# Patient Record
Sex: Female | Born: 1965 | Race: White | Hispanic: No | Marital: Married | Smoking: Never smoker
Health system: Southern US, Community
[De-identification: ages and names within clinical notes are randomized; demographics above are authoritative.]

## PROBLEM LIST (undated history)

## (undated) DIAGNOSIS — M791 Myalgia, unspecified site: Secondary | ICD-10-CM

## (undated) DIAGNOSIS — E785 Hyperlipidemia, unspecified: Secondary | ICD-10-CM

## (undated) DIAGNOSIS — D219 Benign neoplasm of connective and other soft tissue, unspecified: Secondary | ICD-10-CM

## (undated) DIAGNOSIS — E7800 Pure hypercholesterolemia, unspecified: Secondary | ICD-10-CM

## (undated) DIAGNOSIS — I2510 Atherosclerotic heart disease of native coronary artery without angina pectoris: Secondary | ICD-10-CM

## (undated) HISTORY — DX: Pure hypercholesterolemia, unspecified: E78.00

## (undated) HISTORY — DX: Hyperlipidemia, unspecified: E78.5

## (undated) HISTORY — DX: Benign neoplasm of connective and other soft tissue, unspecified: D21.9

## (undated) HISTORY — DX: Myalgia, unspecified site: M79.10

## (undated) HISTORY — DX: Atherosclerotic heart disease of native coronary artery without angina pectoris: I25.10

---

## 2006-07-19 ENCOUNTER — Inpatient Hospital Stay (HOSPITAL_COMMUNITY): Admission: EM | Admit: 2006-07-19 | Discharge: 2006-07-24 | Payer: Self-pay | Admitting: Interventional Cardiology

## 2006-08-08 ENCOUNTER — Encounter (HOSPITAL_COMMUNITY): Admission: RE | Admit: 2006-08-08 | Discharge: 2006-11-06 | Payer: Self-pay | Admitting: Interventional Cardiology

## 2010-06-11 HISTORY — PX: ABDOMINAL HYSTERECTOMY: SHX81

## 2013-03-07 ENCOUNTER — Other Ambulatory Visit: Payer: Self-pay | Admitting: Interventional Cardiology

## 2013-03-07 DIAGNOSIS — E785 Hyperlipidemia, unspecified: Secondary | ICD-10-CM

## 2013-03-28 ENCOUNTER — Encounter: Payer: Self-pay | Admitting: *Deleted

## 2013-03-28 ENCOUNTER — Encounter: Payer: Self-pay | Admitting: Interventional Cardiology

## 2013-03-30 ENCOUNTER — Other Ambulatory Visit: Payer: Self-pay

## 2013-03-31 ENCOUNTER — Ambulatory Visit: Payer: Self-pay | Admitting: Interventional Cardiology

## 2013-03-31 ENCOUNTER — Other Ambulatory Visit: Payer: Self-pay

## 2013-03-31 DIAGNOSIS — I2510 Atherosclerotic heart disease of native coronary artery without angina pectoris: Secondary | ICD-10-CM | POA: Insufficient documentation

## 2013-03-31 DIAGNOSIS — E785 Hyperlipidemia, unspecified: Secondary | ICD-10-CM | POA: Insufficient documentation

## 2013-03-31 DIAGNOSIS — I252 Old myocardial infarction: Secondary | ICD-10-CM | POA: Insufficient documentation

## 2013-10-01 ENCOUNTER — Telehealth: Payer: Self-pay | Admitting: Interventional Cardiology

## 2013-10-01 NOTE — Telephone Encounter (Signed)
Disregard prior documentation. ERROR

## 2013-10-01 NOTE — Telephone Encounter (Signed)
New message          Pt would like to have blood work done first before yearly visit.

## 2013-10-01 NOTE — Telephone Encounter (Signed)
pt was original given cardiac clearance in Jan 2015 by Dr.Smith. pt sts that surgery with Dr.byerly was pushed back to June 11 @ [facility].pt wanted Dr.Smith to know and wanted to know if she was still cleared .routed to [facility] for adv.

## 2013-10-05 NOTE — Telephone Encounter (Signed)
Follow up     Can pt have blood work done before her yearly appt?  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

## 2013-10-05 NOTE — Telephone Encounter (Signed)
pt aware fasting lab appt made for 11/05/13.past due annual with Dr.Smith 11/12/13 @10am  pt aware and verbalized understanding.

## 2013-11-05 ENCOUNTER — Other Ambulatory Visit (INDEPENDENT_AMBULATORY_CARE_PROVIDER_SITE_OTHER): Payer: 59

## 2013-11-05 DIAGNOSIS — E785 Hyperlipidemia, unspecified: Secondary | ICD-10-CM

## 2013-11-05 LAB — ALT: ALT: 11 U/L (ref 0–35)

## 2013-11-06 LAB — NMR LIPOPROFILE WITH LIPIDS
Cholesterol, Total: 188 mg/dL (ref <200)
HDL Particle Number: 42.8 umol/L (ref >=30.5)
HDL SIZE: 9.5 nm (ref >=9.2)
HDL-C: 77 mg/dL (ref >=40)
LARGE VLDL-P: 0.8 nmol/L (ref <=2.7)
LDL (calc): 96 mg/dL (ref <100)
LDL PARTICLE NUMBER: 1320 nmol/L — AB (ref <1000)
LDL Size: 20.7 nm (ref >20.5)
LP-IR Score: <25 (ref <=45)
Large HDL-P: 14.5 umol/L (ref >=4.8)
Small LDL Particle Number: 436 nmol/L (ref <=527)
TRIGLYCERIDES: 76 mg/dL (ref <150)
VLDL Size: 39.3 nm (ref <=46.6)

## 2013-11-10 ENCOUNTER — Encounter: Payer: Self-pay | Admitting: *Deleted

## 2013-11-12 ENCOUNTER — Encounter: Payer: Self-pay | Admitting: Interventional Cardiology

## 2013-11-12 ENCOUNTER — Ambulatory Visit (INDEPENDENT_AMBULATORY_CARE_PROVIDER_SITE_OTHER): Payer: 59 | Admitting: Interventional Cardiology

## 2013-11-12 VITALS — BP 130/82 | HR 50 | Ht 59.5 in | Wt 153.0 lb

## 2013-11-12 DIAGNOSIS — I2510 Atherosclerotic heart disease of native coronary artery without angina pectoris: Secondary | ICD-10-CM

## 2013-11-12 DIAGNOSIS — E785 Hyperlipidemia, unspecified: Secondary | ICD-10-CM

## 2013-11-12 DIAGNOSIS — I252 Old myocardial infarction: Secondary | ICD-10-CM

## 2013-11-12 MED ORDER — NITROGLYCERIN 0.4 MG SL SUBL: 0.4000 mg | SUBLINGUAL_TABLET | SUBLINGUAL | Status: DC | PRN

## 2013-11-12 MED ORDER — ATORVASTATIN CALCIUM 40 MG PO TABS: 40.0000 mg | ORAL_TABLET | Freq: Every day | ORAL | Status: AC

## 2013-11-12 NOTE — Patient Instructions (Signed)
Your physician recommends that you continue on your current medications as directed. Please refer to the Current Medication list given to you today.  Your physician has requested that you have an exercise tolerance test. For further information please visit [email or web address]. Please also follow instruction sheet, as given.   Your physician wants you to follow-up in: 1 year You will receive a reminder letter in the mail two months in advance. If you don't receive a letter, please call our office to schedule the follow-up appointment.  

## 2013-11-12 NOTE — Progress Notes (Signed)
Patient ID: Nicole Grimes, female   DOB: Dec 15, 1965, 48 y.o.   MRN: [id number]    [address] [place], [place]  [address] Phone: [phone] Fax:  [phone]  Date:  11/12/2013   ID:  Nicole Grimes, DOB May 02, 1966, MRN [id number]  PCP:  No primary provider on file.   ASSESSMENT:  1. Coronary atherosclerosis with prior right coronary stent implantation, 2007 2. Recent atypical chest pain 3. Hyperlipidemia with most recent cholesterol concentration of 96 but with a particle #[id number]   PLAN:  1. exercise treadmill test 2. Clinical followup in one year 3. Weight loss and active lifestyle   SUBJECTIVE: Nicole Grimes is a 48 y.o. female who is doing relatively well. All the past 2 weeks she has had atypical discomfort in the chest. Episodes of occurred at random. She is nitroglycerin on one occasion while under a lot of stress at work. The work environment has been stressful because people being laid off. She feels fatigued and has not been able sleep as well. She denies orthopnea, PND, and exertional discomfort in the chest. There is no peripheral edema. No medication side effects appear   Wt Readings from Last 3 Encounters:  11/12/13 153 lb (69.4 kg)     Past Medical History  Diagnosis Date  . Coronary atherosclerosis of native coronary artery     with acute inferior infarction, complicated by ventricular fibrillation x7. Bare metal stent in the proximal right coronary, 2008.  Marland Kitchen Hyperlipidemia   . Myalgia   . Hypercholesteremia   . Fibroid     Current Outpatient Prescriptions  Medication Sig Dispense Refill  . aspirin 81 MG tablet Take 81 mg by mouth daily.      Marland Kitchen atorvastatin (LIPITOR) 40 MG tablet Take 40 mg by mouth daily.      . Multiple Vitamin (MULTIVITAMIN) capsule Take 1 capsule by mouth daily.      . nitroGLYCERIN (NITROSTAT) 0.4 MG SL tablet Place 0.4 mg under the tongue every 5 (five) minutes as needed for chest pain.       No current  facility-administered medications for this visit.    Allergies:    Allergies  Allergen Reactions  . Morphine Sulfate     Social History:  The patient  reports that she has never smoked. She does not have any smokeless tobacco history on file. She reports that she does not drink alcohol.   ROS:  Please see the history of present illness.   No episodes of syncope or palpitation   All other systems reviewed and negative.   OBJECTIVE: VS:  BP 130/82  Pulse 50  Ht 4' 11.5" (1.511 m)  Wt 153 lb (69.4 kg)  BMI 30.40 kg/m2 Well nourished, well developed, in no acute distress, weight loss is apparent HEENT: normal Neck: JVD flat. Carotid bruit absent  Cardiac:  normal S1, S2; RRR; no murmur Lungs:  clear to auscultation bilaterally, no wheezing, rhonchi or rales Abd: soft, nontender, no hepatomegaly Ext: Edema absent. Pulses 2+ Skin: warm and dry Neuro:  CNs 2-12 intact, no focal abnormalities noted  EKG:  Normal sinus rhythm with small inferior Q waves. No change from prior       Signed, Illene Labrador III, MD 11/12/2013 10:50 AM

## 2013-12-31 ENCOUNTER — Encounter: Payer: 59 | Admitting: Physician Assistant

## 2015-07-15 ENCOUNTER — Emergency Department (HOSPITAL_COMMUNITY)
Admission: EM | Admit: 2015-07-15 | Discharge: 2015-07-15 | Disposition: A | Discharge status: Home / Self Care | Payer: 59 | Attending: Emergency Medicine | Admitting: Emergency Medicine

## 2015-07-15 ENCOUNTER — Emergency Department (HOSPITAL_COMMUNITY): Payer: 59

## 2015-07-15 ENCOUNTER — Encounter (HOSPITAL_COMMUNITY): Payer: Self-pay

## 2015-07-15 DIAGNOSIS — Z7982 Long term (current) use of aspirin: Secondary | ICD-10-CM | POA: Insufficient documentation

## 2015-07-15 DIAGNOSIS — R000 Tachycardia, unspecified: Secondary | ICD-10-CM | POA: Diagnosis present

## 2015-07-15 DIAGNOSIS — Z79899 Other long term (current) drug therapy: Secondary | ICD-10-CM | POA: Diagnosis not present

## 2015-07-15 DIAGNOSIS — Z86018 Personal history of other benign neoplasm: Secondary | ICD-10-CM | POA: Insufficient documentation

## 2015-07-15 DIAGNOSIS — E782 Mixed hyperlipidemia: Secondary | ICD-10-CM | POA: Diagnosis not present

## 2015-07-15 DIAGNOSIS — I471 Supraventricular tachycardia: Secondary | ICD-10-CM | POA: Insufficient documentation

## 2015-07-15 DIAGNOSIS — E785 Hyperlipidemia, unspecified: Secondary | ICD-10-CM | POA: Insufficient documentation

## 2015-07-15 LAB — I-STAT TROPONIN, ED: TROPONIN I, POC: 0.02 ng/mL (ref 0.00–0.08)

## 2015-07-15 LAB — COMPREHENSIVE METABOLIC PANEL
ALT: 12 U/L — ABNORMAL LOW (ref 14–54)
AST: 15 U/L (ref 15–41)
Albumin: 3.7 g/dL (ref 3.5–5.0)
Alkaline Phosphatase: 35 U/L — ABNORMAL LOW (ref 38–126)
Anion gap: 10 (ref 5–15)
BUN: 8 mg/dL (ref 6–20)
CO2: 22 mmol/L (ref 22–32)
Calcium: 9 mg/dL (ref 8.9–10.3)
Chloride: 108 mmol/L (ref 101–111)
Creatinine, Ser: 0.8 mg/dL (ref 0.44–1.00)
GFR calc Af Amer: >60 mL/min (ref >60)
Glucose, Bld: 107 mg/dL — ABNORMAL HIGH (ref 65–99)
POTASSIUM: 3.4 mmol/L — AB (ref 3.5–5.1)
SODIUM: 140 mmol/L (ref 135–145)
Total Bilirubin: 0.5 mg/dL (ref 0.3–1.2)
Total Protein: 6.5 g/dL (ref 6.5–8.1)

## 2015-07-15 LAB — CBC WITH DIFFERENTIAL/PLATELET
BASOS PCT: 0 %
Basophils Absolute: 0 10*3/uL (ref 0.0–0.1)
EOS ABS: 0.1 10*3/uL (ref 0.0–0.7)
EOS PCT: 1 %
HCT: 43.9 % (ref 36.0–46.0)
Hemoglobin: 14.7 g/dL (ref 12.0–15.0)
LYMPHS PCT: 17 %
Lymphs Abs: 1.4 10*3/uL (ref 0.7–4.0)
MCH: 30.2 pg (ref 26.0–34.0)
MCHC: 33.5 g/dL (ref 30.0–36.0)
MCV: 90.3 fL (ref 78.0–100.0)
Monocytes Absolute: 0.7 10*3/uL (ref 0.1–1.0)
Monocytes Relative: 9 %
Neutro Abs: 5.7 10*3/uL (ref 1.7–7.7)
Neutrophils Relative %: 73 %
PLATELETS: 220 10*3/uL (ref 150–400)
RBC: 4.86 MIL/uL (ref 3.87–5.11)
RDW: 13.1 % (ref 11.5–15.5)
WBC: 7.9 10*3/uL (ref 4.0–10.5)

## 2015-07-15 NOTE — ED Notes (Signed)
Pt. Coming from work via [employer] for SVT. Pt. Moving boxes at work then had sudden onset of chest pain. Upon EMS arrival pt. 212 bpm and SVT rhythm. Pt. Instructed to vagal and converted. Pt. HR returned to normal at this time 86 bpm,. Pt. Hx of stemi with stent placement 10 years ago. Pt. Describes symptoms similar to today's episode with MI. Pt. Given 4L O2 on scene because of PVC. Pt. Soft BP is her normal per pt. Pt. Also given 230mL NS en route.

## 2015-07-15 NOTE — ED Provider Notes (Signed)
CSN: [id number]     Arrival date & time 07/15/15  1154 History   First MD Initiated Contact with Patient 07/15/15 1155     Chief Complaint  Patient presents with  . Tachycardia     (Consider location/radiation/quality/duration/timing/severity/associated sxs/prior Treatment) The history is provided by the patient.  Nicole Grimes is a 50 y.o. female hx of CAD s/p stent, HTN, HL, here with SVT, chest pain. Patient was moving some boxes at work and noticed sudden onset of chest pain with palpitations. She doesn't feel short of breath. She sat down and called EMS. EMS noticed that she was in svt with HR around 210. She tried vagal maneuvers and was given IVF and spontaneously converted. Denies any chest pain now. Stent was placed 10 years ago by Dr. Tamala Julian. She has seen him for follow up regularly and states that had several echos that were unremarkable. No hx of Afib and not on blood thinners.    Past Medical History  Diagnosis Date  . Coronary atherosclerosis of native coronary artery     with acute inferior infarction, complicated by ventricular fibrillation x7. Bare metal stent in the proximal right coronary, 2008.  Marland Kitchen Hyperlipidemia   . Myalgia   . Hypercholesteremia   . Fibroid    Past Surgical History  Procedure Laterality Date  . Abdominal hysterectomy  2012   Family History  Problem Relation Age of Onset  . Heart attack Mother    Social History  Substance Use Topics  . Smoking status: Never Smoker   . Smokeless tobacco: Not on file  . Alcohol Use: No   OB History    Gravida Para Term Preterm AB TAB SAB Ectopic Multiple Living   2    1  1   1      Review of Systems  Cardiovascular: Positive for chest pain.  All other systems reviewed and are negative.     Allergies  Morphine sulfate  Home Medications   Prior to Admission medications   Medication Sig Start Date End Date Taking? Authorizing Provider  albuterol (PROVENTIL HFA;VENTOLIN HFA) 108 (90 Base) MCG/ACT  inhaler Inhale 2 puffs into the lungs every 6 (six) hours as needed for wheezing or shortness of breath.   Yes Historical Provider, MD  aspirin 81 MG tablet Take 81 mg by mouth daily.   Yes Historical Provider, MD  Multiple Vitamin (MULTIVITAMIN) capsule Take 1 capsule by mouth daily.   Yes Historical Provider, MD  simvastatin (ZOCOR) 20 MG tablet Take 20 mg by mouth daily.   Yes Historical Provider, MD  atorvastatin (LIPITOR) 40 MG tablet Take 1 tablet (40 mg total) by mouth daily. 11/12/13   Belva Crome, MD  nitroGLYCERIN (NITROSTAT) 0.4 MG SL tablet Place 1 tablet (0.4 mg total) under the tongue every 5 (five) minutes as needed for chest pain. 11/12/13   Belva Crome, MD   BP 111/62 mmHg  Pulse 67  Temp(Src) 99.1 F (37.3 C) (Oral)  Resp 11  Ht 5' (1.524 m)  Wt 153 lb (69.4 kg)  BMI 29.88 kg/m2  SpO2 99% Physical Exam  Constitutional: She is oriented to person, place, and time. She appears well-developed and well-nourished.  HENT:  Head: Normocephalic.  Mouth/Throat: Oropharynx is clear and moist.  Eyes: Conjunctivae are normal. Pupils are equal, round, and reactive to light.  Neck: Normal range of motion. Neck supple.  Cardiovascular: Normal rate, regular rhythm and normal heart sounds.   Pulmonary/Chest: Effort normal and breath sounds normal. No  respiratory distress. She has no wheezes. She has no rales.  Abdominal: Soft. Bowel sounds are normal. She exhibits no distension. There is no tenderness. There is no rebound.  Musculoskeletal: Normal range of motion.  Neurological: She is alert and oriented to person, place, and time.  Skin: Skin is warm and dry.  Psychiatric: She has a normal mood and affect. Her behavior is normal. Judgment and thought content normal.  Nursing note and vitals reviewed.   ED Course  Procedures (including critical care time) Labs Review Labs Reviewed  COMPREHENSIVE METABOLIC PANEL - Abnormal; Notable for the following:    Potassium 3.4 (*)     Glucose, Bld 107 (*)    ALT 12 (*)    Alkaline Phosphatase 35 (*)    All other components within normal limits  CBC WITH DIFFERENTIAL/PLATELET  Randolm Idol, ED    Imaging Review Dg Chest 2 View  07/15/2015  CLINICAL DATA:  Heavy lifting.  Chest pain. EXAM: CHEST  2 VIEW COMPARISON:  None. FINDINGS: The heart size and mediastinal contours are within normal limits. Both lungs are clear. The visualized skeletal structures are unremarkable. IMPRESSION: No active cardiopulmonary disease. Electronically Signed   By: Kerby Moors M.D.   On: 07/15/2015 13:15   I have personally reviewed and evaluated these images and lab results as part of my medical decision-making.   EKG Interpretation   Date/Time:  Friday July 15 2015 11:56:59 EST Ventricular Rate:  77 PR Interval:  134 QRS Duration: 93 QT Interval:  364 QTC Calculation: 412 R Axis:   70 Text Interpretation:  Sinus or ectopic atrial rhythm Probable left atrial  enlargement No significant change since last tracing Confirmed by YAO  MD,  DAVID ([id number]) on 07/15/2015 12:09:28 PM      MDM   Final diagnoses:  None   Nicole Grimes is a 50 y.o. female here with chest pain, SVT. Pain likely from SVT. I reviewed EKG from EMS and it appears to be SVT instead of rapid afib. Will monitor on tele here. Will check labs and trop.   1:50 PM No events on tele. Labs and trop unremarkable. Since she just had SVT and has no chest pain, I will not do delta troponin as she may have had some demand ischemia that is not clinically significant. Recommend follow up with Dr. Tamala Julian from cardiology. No exertion for several days.   Wandra Arthurs, MD 07/15/15 1351

## 2015-07-15 NOTE — Discharge Instructions (Signed)
Avoid exerting yourself for several days.   See Dr. Tamala Julian in the office for follow up.   Return to ER if you have worse chest pain, shortness of breath, palpitations.    Paroxysmal Supraventricular Tachycardia Paroxysmal supraventricular tachycardia (PSVT) is when your heart beats very quickly and then suddenly stops beating so quickly. You may or may not have any symptoms when this occurs. It is usually not dangerous. It can lead to problems if it happens often or it lasts a long time. HOME CARE   Take medicines only as told by your doctor.  Avoid caffeine or limit how much of it you consume as told by your doctor. Caffeine is found in coffee, tea, soda, and chocolate.  Avoid alcohol or limit how much of it you drink as told by your doctor.  Do not smoke.  Try to get at least 7 hours of sleep each night.  Find healthy ways to reduce stress.  Do self-treatments as told by your doctor to slow down your heart (vagus nerve stimulation). Your doctor may tell you to:  Hold your breath and push, as though you are going to the bathroom.  Rub an area on one side of your neck.  Bend forward with your head between your legs.  Bend forward with your head between your legs, then cough.  Rub your eyeballs with your eyes closed.  Maintain a healthy weight.  Get some exercise on most days. Ask your doctor about some good activities for you. GET HELP IF:  You are having episodes of a fast heartbeat more often.  Your episodes are lasting longer.  Your self-treatments to slow down your heart are no longer helping.  You have new symptoms during an episode. GET HELP RIGHT AWAY IF:  You have chest pain.  You have trouble breathing.  You have an episode of a fast heartbeat that lasts longer than 20 minutes.  You pass out (faint). These symptoms may be an emergency. Do not wait to see if the symptoms will go away. Get medical help right away. Call your local emergency services (911  in the [place] Do not drive yourself to the hospital.   This information is not intended to replace advice given to you by your health care provider. Make sure you discuss any questions you have with your health care provider.   Document Released: 05/28/2005 Document Revised: 06/18/2014 Document Reviewed: 11/05/2013 Elsevier Interactive Patient Education [employer].

## 2015-08-10 ENCOUNTER — Encounter: Payer: Self-pay | Admitting: Nurse Practitioner

## 2015-08-10 ENCOUNTER — Ambulatory Visit (INDEPENDENT_AMBULATORY_CARE_PROVIDER_SITE_OTHER): Payer: 59 | Admitting: Nurse Practitioner

## 2015-08-10 VITALS — BP 140/80 | HR 57 | Ht 60.0 in | Wt 157.8 lb

## 2015-08-10 DIAGNOSIS — E785 Hyperlipidemia, unspecified: Secondary | ICD-10-CM

## 2015-08-10 DIAGNOSIS — I2510 Atherosclerotic heart disease of native coronary artery without angina pectoris: Secondary | ICD-10-CM | POA: Diagnosis not present

## 2015-08-10 LAB — BASIC METABOLIC PANEL
BUN: 7 mg/dL (ref 7–25)
CO2: 22 mmol/L (ref 20–31)
Calcium: 9.3 mg/dL (ref 8.6–10.4)
Chloride: 103 mmol/L (ref 98–110)
Creat: 0.67 mg/dL (ref 0.50–1.05)
Glucose, Bld: 96 mg/dL (ref 65–99)
Potassium: 4.3 mmol/L (ref 3.5–5.3)
Sodium: 137 mmol/L (ref 135–146)

## 2015-08-10 LAB — LIPID PANEL
Cholesterol: 236 mg/dL — ABNORMAL HIGH (ref 125–200)
HDL: 88 mg/dL (ref >=46)
LDL Cholesterol: 125 mg/dL (ref <130)
Total CHOL/HDL Ratio: 2.7 Ratio (ref <=5.0)
Triglycerides: 115 mg/dL (ref <150)
VLDL: 23 mg/dL (ref <30)

## 2015-08-10 LAB — HEPATIC FUNCTION PANEL
ALT: 10 U/L (ref 6–29)
AST: 13 U/L (ref 10–35)
Albumin: 4 g/dL (ref 3.6–5.1)
Alkaline Phosphatase: 39 U/L (ref 33–130)
Bilirubin, Direct: 0.1 mg/dL (ref <=0.2)
Indirect Bilirubin: 0.4 mg/dL (ref 0.2–1.2)
Total Bilirubin: 0.5 mg/dL (ref 0.2–1.2)
Total Protein: 6.9 g/dL (ref 6.1–8.1)

## 2015-08-10 LAB — TSH: TSH: 3.16 mIU/L

## 2015-08-10 MED ORDER — NITROGLYCERIN 0.4 MG SL SUBL: 0.4000 mg | SUBLINGUAL_TABLET | SUBLINGUAL | Status: AC | PRN

## 2015-08-10 NOTE — Addendum Note (Signed)
Addended by: Burtis Junes on: 08/10/2015 09:14 AM   Modules accepted: Orders

## 2015-08-10 NOTE — Patient Instructions (Addendum)
We will be checking the following labs today - BMET, HPF, Lipids and TSH   Medication Instructions:    Continue with your current medicines.     Testing/Procedures To Be Arranged:  Echocardiogram  Follow-Up:   See Dr. Tamala Julian in 6 months    Other Special Instructions:   No decongestants  No nasal sprays other than Flonase or Nasocort  No caffeine    If you need a refill on your cardiac medications before your next appointment, please call your pharmacy.   Call the [place] office at [phone] if you have any questions, problems or concerns.

## 2015-08-10 NOTE — Progress Notes (Addendum)
CARDIOLOGY OFFICE NOTE  Date:  08/10/2015    Nicole Grimes Date of Birth: 1966/04/20 Medical Record [id number]  PCP:  No primary care provider on file.  Cardiologist:  Tamala Julian    Chief Complaint  Patient presents with  . Coronary Artery Disease    Follow up visit - seen for Dr. Tamala Julian    History of Present Illness: Nicole Grimes is a 50 y.o. female who presents today for a follow up visit. Seen for Dr. Tamala Julian.   Has not been seen since 2015. GXT recommended at that visit - she did not follow thru.   Has a history of CAD with prior stent placement and HLD.   Presented to the ER in early February with chest pain and palpitations - found to have SVT by EKG from EMS (but not in the system) - she had been using some nose spray and admits to excessive caffeine. Converted with IV fluids. No medicine changes. Was asked to follow up here.   Comes in today. Here with her mother. Says she is drinking less caffeine. No more nose spray. No alcohol use. No chest pain. May have had a "twinge" of palpitations while moving some stock at work but nothing like what took her to the hospital. Not short of breath. No regular exercise. Takes her medicines sporadically due to her funds. Says she is "poor" and that is the reason she has not followed up here sooner. Never got her GXT.    Past Medical History  Diagnosis Date  . Coronary atherosclerosis of native coronary artery     with acute inferior infarction, complicated by ventricular fibrillation x7. Bare metal stent in the proximal right coronary, 2008.  Marland Kitchen Hyperlipidemia   . Myalgia   . Hypercholesteremia   . Fibroid     Past Surgical History  Procedure Laterality Date  . Abdominal hysterectomy  2012     Medications: Current Outpatient Prescriptions  Medication Sig Dispense Refill  . albuterol (PROVENTIL HFA;VENTOLIN HFA) 108 (90 Base) MCG/ACT inhaler Inhale 2 puffs into the lungs every 6 (six) hours as needed for wheezing or shortness  of breath.    Marland Kitchen atorvastatin (LIPITOR) 40 MG tablet Take 1 tablet (40 mg total) by mouth daily. 30 tablet 11  . nitroGLYCERIN (NITROSTAT) 0.4 MG SL tablet Place 1 tablet (0.4 mg total) under the tongue every 5 (five) minutes as needed for chest pain. 25 tablet 3  . simvastatin (ZOCOR) 20 MG tablet Take 20 mg by mouth daily.    Marland Kitchen aspirin 81 MG tablet Take 81 mg by mouth daily. Reported on 08/10/2015     No current facility-administered medications for this visit.    Allergies: Allergies  Allergen Reactions  . Morphine Sulfate Nausea Only    Social History: The patient  reports that she has never smoked. She does not have any smokeless tobacco history on file. She reports that she does not drink alcohol.   Family History: The patient's family history includes COPD in her mother; Cancer in her maternal grandfather, maternal grandmother, and paternal grandmother; Hyperlipidemia in her father and mother; Hypertension in her father; Stroke in her father and paternal grandmother.   Review of Systems: Please see the history of present illness.   Otherwise, the review of systems is positive for none.   All other systems are reviewed and negative.   Physical Exam: VS:  BP 140/80 mmHg  Pulse 57  Ht 5' (1.524 m)  Wt 157 lb 12.8 oz (  71.578 kg)  BMI 30.82 kg/m2 .  BMI Body mass index is 30.82 kg/(m^2).  Wt Readings from Last 3 Encounters:  08/10/15 157 lb 12.8 oz (71.578 kg)  07/15/15 153 lb (69.4 kg)  11/12/13 153 lb (69.4 kg)    General: Pleasant. Well developed, well nourished and in no acute distress.  HEENT: Normal but with missing teeth. Neck: Supple, no JVD, carotid bruits, or masses noted.  Cardiac: Regular rate and rhythm. No murmurs, rubs, or gallops. No edema.  Respiratory:  Lungs are clear to auscultation bilaterally with normal work of breathing.  GI: Soft and nontender.  MS: No deformity or atrophy. Gait and ROM intact. Skin: Warm and dry. Color is normal.  Neuro:  Strength  and sensation are intact and no gross focal deficits noted.  Psych: Alert, appropriate and with normal affect.   LABORATORY DATA:  EKG:  EKG is ordered today. This demonstrates sinus bradycardia.  Lab Results  Component Value Date   WBC 7.9 07/15/2015   HGB 14.7 07/15/2015   HCT 43.9 07/15/2015   PLT 220 07/15/2015   GLUCOSE 107* 07/15/2015   CHOL 188 11/05/2013   TRIG 76 11/05/2013   HDL 77 11/05/2013   LDLCALC 96 11/05/2013   ALT 12* 07/15/2015   AST 15 07/15/2015   NA 140 07/15/2015   K 3.4* 07/15/2015   CL 108 07/15/2015   CREATININE 0.80 07/15/2015   BUN 8 07/15/2015   CO2 22 07/15/2015    BNP (last 3 results) No results for input(s): BNP in the last 8760 hours.  ProBNP (last 3 results) No results for input(s): PROBNP in the last 8760 hours.   Other Studies Reviewed Today:   Assessment/Plan:  1. Episode of presumed SVT according to ER note - the actual EKG from EMS is not in the system - will try to obtain. She has a resting bradycardia. Would hold on adding beta blocker. Check echo and TSH today. Restrict caffeine. Avoid all decongestants. Would place monitor if has recurring spells.   2. CAD - no recurrent chest pain - would get her GXT as previously planned.   3. HLD - sporadically taking her statin - recheck labs today  4. Obesity    Current medicines are reviewed with the patient today.  The patient does not have concerns regarding medicines other than what has been noted above.  The following changes have been made:  See above.  Labs/ tests ordered today include:    Orders Placed This Encounter  Procedures  . Basic metabolic panel  . Hepatic function panel  . Lipid panel  . TSH  . Exercise Tolerance Test  . EKG 12-Lead  . ECHOCARDIOGRAM COMPLETE     Disposition:   FU with Dr. Tamala Julian in 6 months.   Patient is agreeable to this plan and will call if any problems develop in the interim.   Signed: Burtis Junes, RN, ANP-C 08/10/2015  8:59 AM  [facility] [address] [place], [place]  [address] Phone: [phone] Fax: [phone]      Addendum: EMS documentation from 07/15/2015 obtained. EKG with SVT with rate of 180 noted.  Records to be scanned into EPIC.   Burtis Junes, RN, [place] [place] [address] [place], [place]  [address] [phone]

## 2015-08-15 ENCOUNTER — Telehealth: Payer: Self-pay | Admitting: Nurse Practitioner

## 2015-08-15 ENCOUNTER — Other Ambulatory Visit: Payer: Self-pay | Admitting: *Deleted

## 2015-08-15 MED ORDER — SIMVASTATIN 20 MG PO TABS: 20.0000 mg | ORAL_TABLET | Freq: Every day | ORAL | Status: AC

## 2015-08-15 NOTE — Telephone Encounter (Signed)
Patient was seen by Truitt Merle or NP  08/10/2015 while here patient mentioned she had EKG strips done while in EMS transport. I have called Medical Records at [facility] and no one there  New how to direct me as far as how to obtain the EKG strips. I called the ER Department  with [facility] spoke with the receptionist there and she stated they never received the strips from the  EMS truck. I then called the EKG Department ([phone]) spoke with Leda Gauze she stated they do/or did not get the copies of the strips from the ambulance. What is scanned into Epic  Is all we have. Cecille Rubin and Andee Poles have been made aware.

## 2015-08-31 ENCOUNTER — Ambulatory Visit (HOSPITAL_COMMUNITY): Payer: 59 | Attending: Cardiology

## 2015-08-31 ENCOUNTER — Telehealth: Payer: Self-pay | Admitting: Interventional Cardiology

## 2015-08-31 ENCOUNTER — Other Ambulatory Visit: Payer: Self-pay

## 2015-08-31 DIAGNOSIS — E669 Obesity, unspecified: Secondary | ICD-10-CM | POA: Diagnosis not present

## 2015-08-31 DIAGNOSIS — Z6830 Body mass index (BMI) 30.0-30.9, adult: Secondary | ICD-10-CM | POA: Diagnosis not present

## 2015-08-31 DIAGNOSIS — I071 Rheumatic tricuspid insufficiency: Secondary | ICD-10-CM | POA: Diagnosis not present

## 2015-08-31 DIAGNOSIS — I10 Essential (primary) hypertension: Secondary | ICD-10-CM | POA: Insufficient documentation

## 2015-08-31 DIAGNOSIS — E785 Hyperlipidemia, unspecified: Secondary | ICD-10-CM | POA: Diagnosis not present

## 2015-08-31 DIAGNOSIS — I2510 Atherosclerotic heart disease of native coronary artery without angina pectoris: Secondary | ICD-10-CM | POA: Insufficient documentation

## 2015-08-31 NOTE — Telephone Encounter (Signed)
Pt aware of echo results with verbal understanding. Pt rqst a copy of results me mailed to her. done

## 2015-08-31 NOTE — Telephone Encounter (Signed)
Follow Up  ° °Pt is calling regarding test results. Please call. °

## 2017-01-21 IMAGING — DX DG CHEST 2V
2 series · 2 of 2 positions shown · non-contrast
Comparison: None.

CLINICAL DATA: Heavy lifting.  Chest pain.

EXAM:
CHEST  2 VIEW

[chest pa]
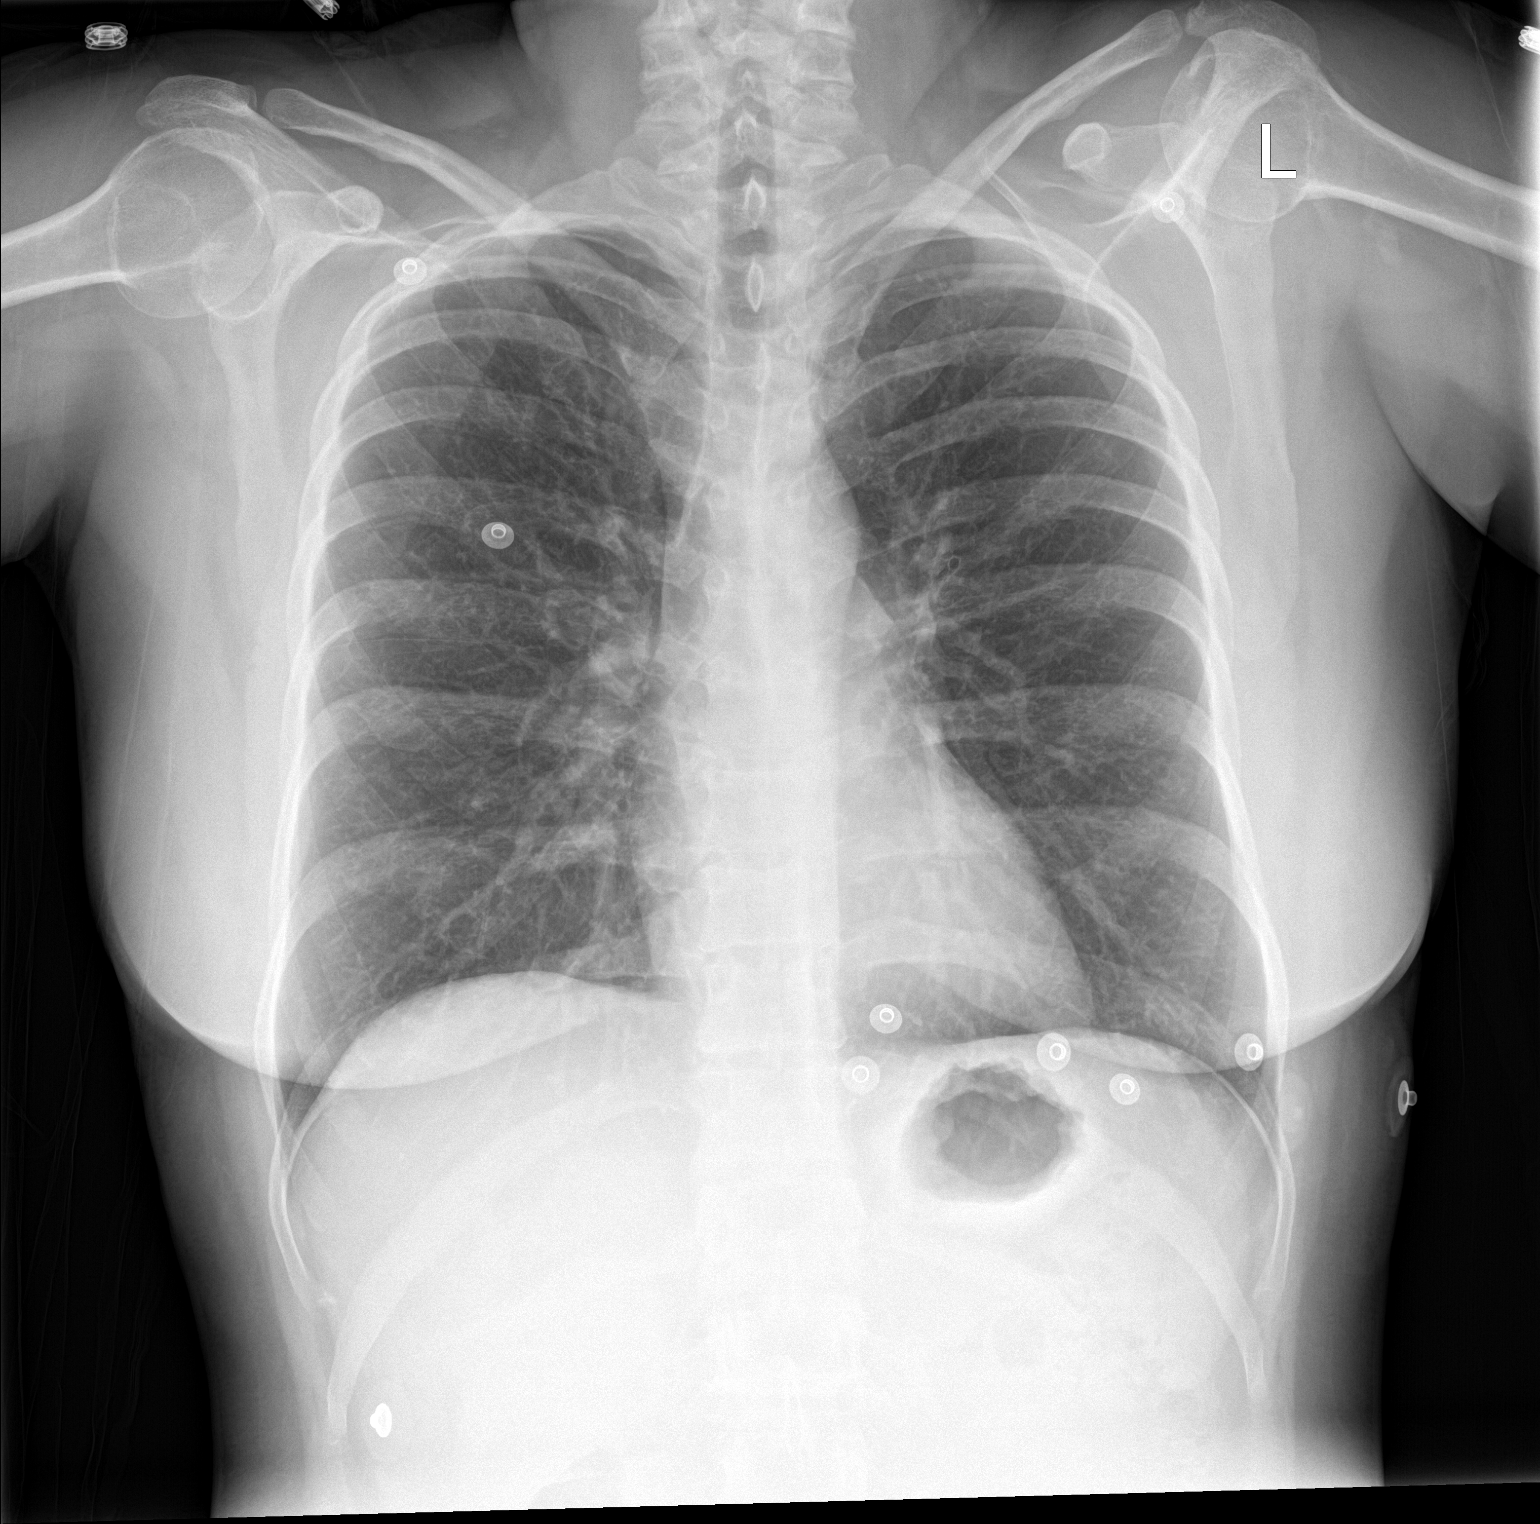

[chest lat]
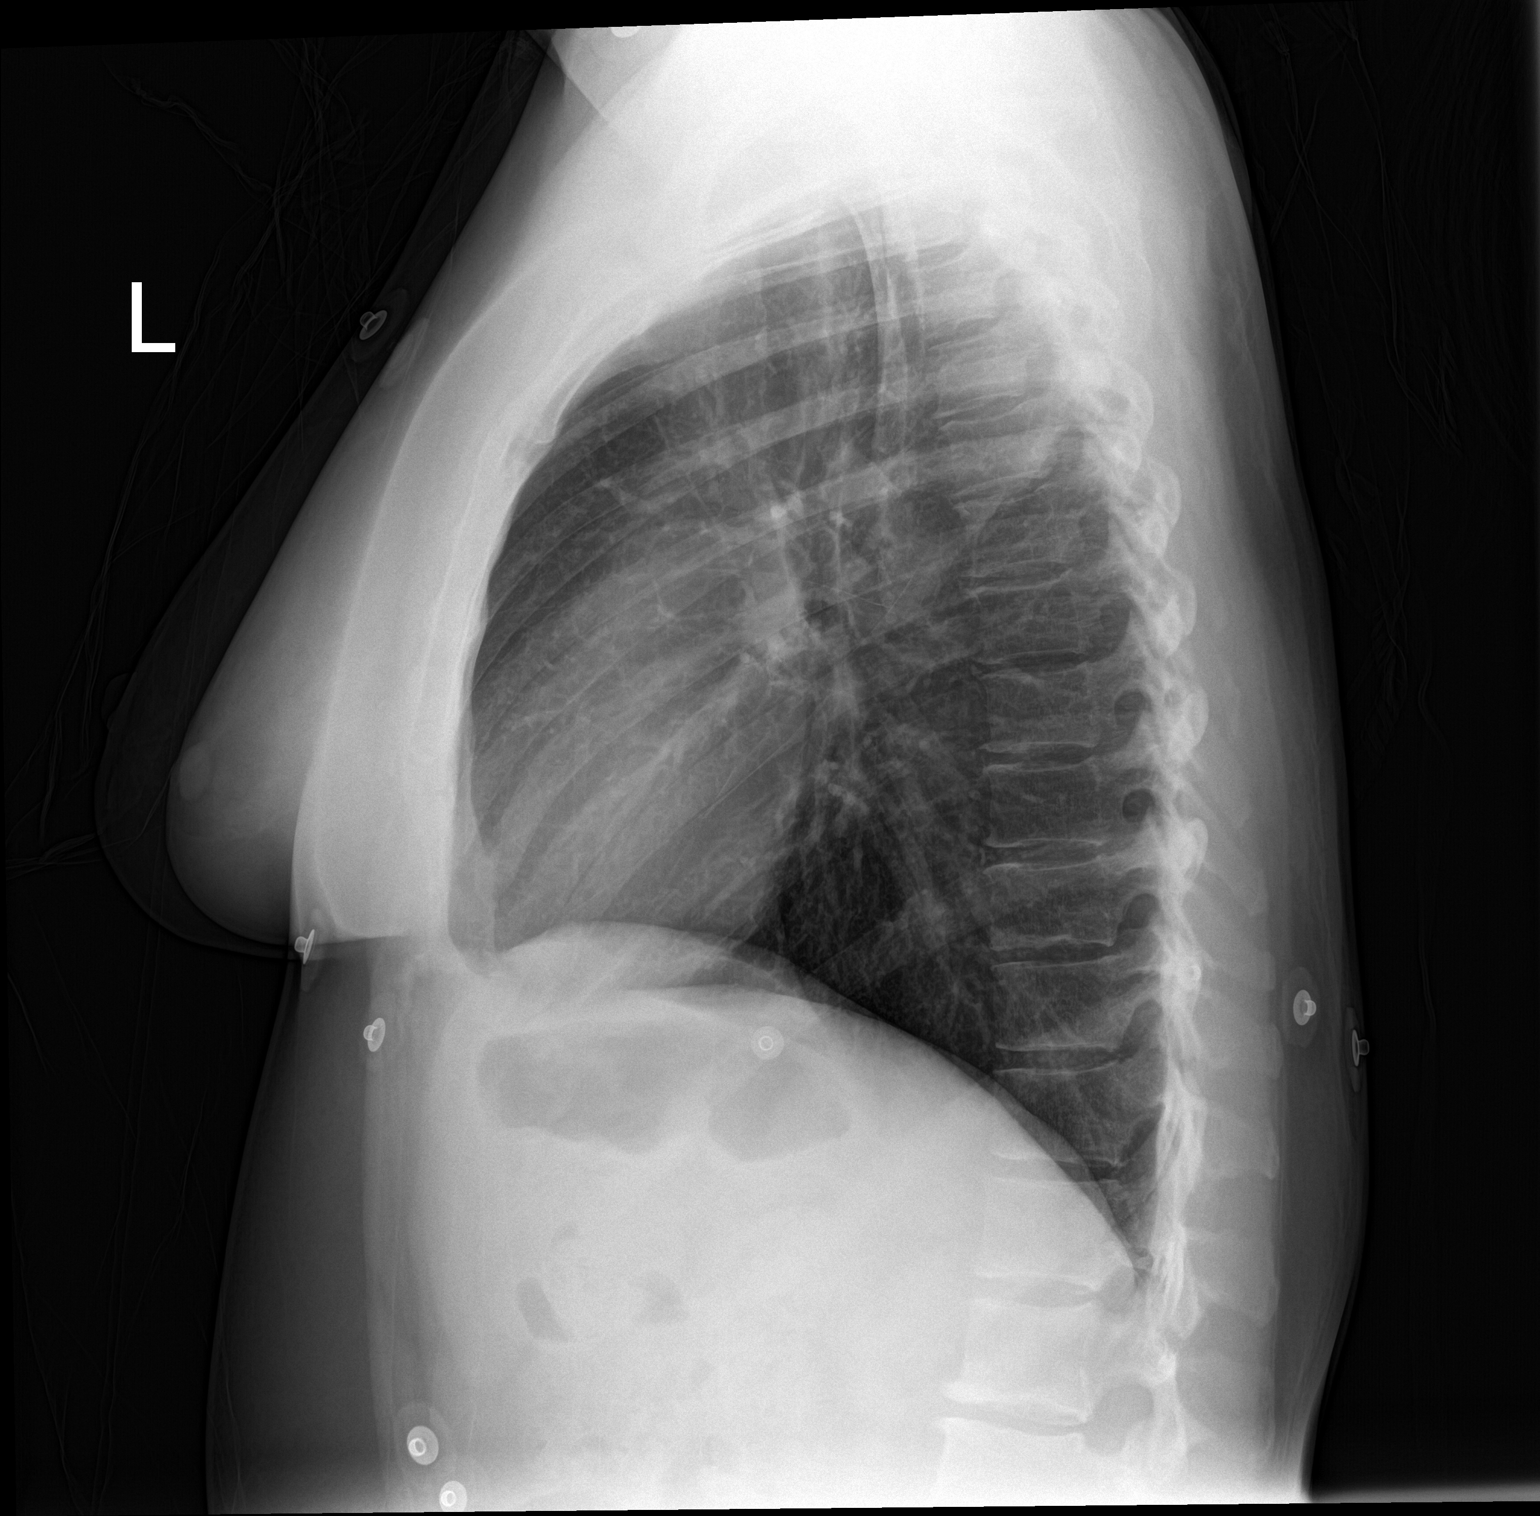

[2 of 2 positions shown; findings below may reference images not displayed]

FINDINGS: The heart size and mediastinal contours are within normal limits.
Both lungs are clear. The visualized skeletal structures are
unremarkable.
IMPRESSION: No active cardiopulmonary disease.
